# Patient Record
Sex: Female | Born: 1995 | Race: Black or African American | Hispanic: No | Marital: Single
Health system: Southern US, Community
[De-identification: ages and names within clinical notes are randomized; demographics above are authoritative.]

## PROBLEM LIST (undated history)

## (undated) DIAGNOSIS — N83209 Unspecified ovarian cyst, unspecified side: Secondary | ICD-10-CM

## (undated) DIAGNOSIS — J45909 Unspecified asthma, uncomplicated: Secondary | ICD-10-CM

## (undated) HISTORY — PX: HERNIA REPAIR: SHX51

---

## 2019-03-23 ENCOUNTER — Encounter (HOSPITAL_COMMUNITY): Payer: Self-pay | Admitting: Emergency Medicine

## 2019-03-23 ENCOUNTER — Emergency Department (HOSPITAL_COMMUNITY)
Admission: EM | Admit: 2019-03-23 | Discharge: 2019-03-23 | Disposition: A | Discharge status: Home / Self Care | Payer: 59 | Attending: Emergency Medicine | Admitting: Emergency Medicine

## 2019-03-23 ENCOUNTER — Other Ambulatory Visit: Payer: Self-pay

## 2019-03-23 ENCOUNTER — Ambulatory Visit (HOSPITAL_COMMUNITY)
Admission: EM | Admit: 2019-03-23 | Discharge: 2019-03-23 | Disposition: A | Discharge status: Other Acute Inpatient Hospital | Payer: Self-pay

## 2019-03-23 ENCOUNTER — Emergency Department (HOSPITAL_COMMUNITY): Payer: 59

## 2019-03-23 DIAGNOSIS — N83201 Unspecified ovarian cyst, right side: Secondary | ICD-10-CM

## 2019-03-23 DIAGNOSIS — N83202 Unspecified ovarian cyst, left side: Secondary | ICD-10-CM | POA: Insufficient documentation

## 2019-03-23 DIAGNOSIS — R1030 Lower abdominal pain, unspecified: Secondary | ICD-10-CM | POA: Diagnosis present

## 2019-03-23 LAB — CBC WITH DIFFERENTIAL/PLATELET
Abs Immature Granulocytes: 0.01 10*3/uL (ref 0.00–0.07)
Basophils Absolute: 0 10*3/uL (ref 0.0–0.1)
Basophils Relative: 1 %
Eosinophils Absolute: 0.3 10*3/uL (ref 0.0–0.5)
Eosinophils Relative: 5 %
HCT: 37.9 % (ref 36.0–46.0)
Hemoglobin: 12.3 g/dL (ref 12.0–15.0)
Immature Granulocytes: 0 %
Lymphocytes Relative: 36 %
Lymphs Abs: 1.8 10*3/uL (ref 0.7–4.0)
MCH: 27.4 pg (ref 26.0–34.0)
MCHC: 32.5 g/dL (ref 30.0–36.0)
MCV: 84.4 fL (ref 80.0–100.0)
Monocytes Absolute: 0.6 10*3/uL (ref 0.1–1.0)
Monocytes Relative: 12 %
Neutro Abs: 2.3 10*3/uL (ref 1.7–7.7)
Neutrophils Relative %: 46 %
Platelets: 351 10*3/uL (ref 150–400)
RBC: 4.49 MIL/uL (ref 3.87–5.11)
RDW: 15.2 % (ref 11.5–15.5)
WBC: 5 10*3/uL (ref 4.0–10.5)
nRBC: 0 % (ref 0.0–0.2)

## 2019-03-23 LAB — URINALYSIS, ROUTINE W REFLEX MICROSCOPIC
Bilirubin Urine: NEGATIVE
Glucose, UA: NEGATIVE mg/dL
Hgb urine dipstick: NEGATIVE
Ketones, ur: NEGATIVE mg/dL
Leukocytes,Ua: NEGATIVE
Nitrite: NEGATIVE
Protein, ur: NEGATIVE mg/dL
Specific Gravity, Urine: 1.032 — ABNORMAL HIGH (ref 1.005–1.030)
pH: 7 (ref 5.0–8.0)

## 2019-03-23 LAB — I-STAT BETA HCG BLOOD, ED (MC, WL, AP ONLY): I-stat hCG, quantitative: <5 m[IU]/mL (ref <5)

## 2019-03-23 LAB — COMPREHENSIVE METABOLIC PANEL
ALT: 10 U/L (ref 0–44)
AST: 18 U/L (ref 15–41)
Albumin: 3.3 g/dL — ABNORMAL LOW (ref 3.5–5.0)
Alkaline Phosphatase: 27 U/L — ABNORMAL LOW (ref 38–126)
Anion gap: 7 (ref 5–15)
BUN: 6 mg/dL (ref 6–20)
CO2: 21 mmol/L — ABNORMAL LOW (ref 22–32)
Calcium: 8.9 mg/dL (ref 8.9–10.3)
Chloride: 107 mmol/L (ref 98–111)
Creatinine, Ser: 0.81 mg/dL (ref 0.44–1.00)
GFR calc Af Amer: >60 mL/min (ref >60)
GFR calc non Af Amer: >60 mL/min (ref >60)
Glucose, Bld: 119 mg/dL — ABNORMAL HIGH (ref 70–99)
Potassium: 3.7 mmol/L (ref 3.5–5.1)
Sodium: 135 mmol/L (ref 135–145)
Total Bilirubin: 0.4 mg/dL (ref 0.3–1.2)
Total Protein: 7.4 g/dL (ref 6.5–8.1)

## 2019-03-23 LAB — LIPASE, BLOOD: Lipase: 41 U/L (ref 11–51)

## 2019-03-23 MED ORDER — NAPROXEN 375 MG PO TABS: 375.0000 mg | ORAL_TABLET | Freq: Two times a day (BID) | ORAL | 0 refills | Status: DC

## 2019-03-23 MED ORDER — SODIUM CHLORIDE 0.9 % IV BOLUS
1000.0000 mL | Freq: Once | INTRAVENOUS | Status: AC
  Administered 2019-03-23: 19:00:00 1000 mL via INTRAVENOUS

## 2019-03-23 MED ORDER — FENTANYL CITRATE (PF) 100 MCG/2ML IJ SOLN
50.0000 ug | Freq: Once | INTRAMUSCULAR | Status: AC
  Administered 2019-03-23: 50 ug via INTRAVENOUS
  Filled 2019-03-23: qty 2

## 2019-03-23 MED ORDER — ONDANSETRON HCL 4 MG/2ML IJ SOLN
4.0000 mg | Freq: Once | INTRAMUSCULAR | Status: AC
  Administered 2019-03-23: 19:00:00 4 mg via INTRAVENOUS
  Filled 2019-03-23: qty 2

## 2019-03-23 MED ORDER — IOHEXOL 300 MG/ML  SOLN
100.0000 mL | Freq: Once | INTRAMUSCULAR | Status: AC | PRN
  Administered 2019-03-23: 100 mL via INTRAVENOUS

## 2019-03-23 MED ORDER — OXYCODONE-ACETAMINOPHEN 5-325 MG PO TABS
1.0000 | ORAL_TABLET | Freq: Once | ORAL | Status: AC
  Administered 2019-03-23: 22:00:00 1 via ORAL
  Filled 2019-03-23: qty 1

## 2019-03-23 MED ORDER — ONDANSETRON HCL 4 MG PO TABS: 4.0000 mg | ORAL_TABLET | Freq: Three times a day (TID) | ORAL | 0 refills | Status: DC | PRN

## 2019-03-23 MED ORDER — HYDROCODONE-ACETAMINOPHEN 5-325 MG PO TABS: 1.0000 | ORAL_TABLET | Freq: Four times a day (QID) | ORAL | 0 refills | Status: AC | PRN

## 2019-03-23 NOTE — ED Notes (Signed)
Nurse collected labs. 

## 2019-03-23 NOTE — ED Notes (Signed)
Patient verbalizes understanding of discharge instructions. Opportunity for questioning and answers were provided. Armband removed by staff, pt discharged from ED ambulatory.   

## 2019-03-23 NOTE — ED Notes (Signed)
Asked pt for a urine sample.  She stated she could not go at this time.

## 2019-03-23 NOTE — ED Provider Notes (Signed)
[facility] EMERGENCY DEPARTMENT Provider Note   CSN: [id number] Arrival date & time: 03/23/19  1843    History   Chief Complaint No chief complaint on file.   HPI Jeanne Nelson is a 23 y.o. female who to the emergency department chief complaint of abdominal pain.  Patient states that she has been having intermittent pain in her abdomen on the right side for the past several days. Patient states that the pain is located near her bellybutton and radiates upward toward her diaphragm.  She states that the pain became severe today and is worse with any movement coughing or laughing.  It seems to radiate into her pelvis and across the pelvic region.She had one episode of vomiting today.  She had one episode of watery diarrhea and one episode of loose stools this morning.She has a history of ovarian cyst and thought that her symptoms might be related to this.  She denies vaginal symptoms, vaginal bleeding, urinary symptoms.She has no previous history of abdominal surgeries.  She denies fever or chills.She denies urinary symptoms or back pain    HPI  History reviewed. No pertinent past medical history.  There are no active problems to display for this patient.   History reviewed. No pertinent surgical history.   OB History   No obstetric history on file.      Home Medications    Prior to Admission medications   Medication Sig Start Date End Date Taking? Authorizing Provider  HYDROcodone-acetaminophen (NORCO) 5-325 MG tablet Take 1 tablet by mouth every 6 (six) hours as needed. 03/23/19   Cianni Manny, Cammy Copa, PA-C  naproxen (NAPROSYN) 375 MG tablet Take 1 tablet (375 mg total) by mouth 2 (two) times daily. 03/23/19   Arthor Captain, PA-C  ondansetron (ZOFRAN) 4 MG tablet Take 1 tablet (4 mg total) by mouth every 8 (eight) hours as needed for nausea or vomiting. 03/23/19   Arthor Captain, PA-C    Family History No family history on file.  Social History Social History    Tobacco Use  . Smoking status: Not on file  Substance Use Topics  . Alcohol use: Not on file  . Drug use: Not on file     Allergies   Patient has no allergy information on record.   Review of Systems Review of Systems Ten systems reviewed and are negative for acute change, except as noted in the HPI.    Physical Exam Updated Vital Signs BP (!) 149/104 (BP Location: Right Arm)   Pulse 81   Temp 99.6 F (37.6 C) (Oral)   Resp 16   Ht  (1.702 m)   Wt 72.6 kg   SpO2 100%   BMI 25.06 kg/m   Physical Exam Vitals signs and nursing note reviewed.  Constitutional:      General: She is not in acute distress.    Appearance: She is well-developed. She is not diaphoretic.  HENT:     Head: Normocephalic and atraumatic.  Eyes:     General: No scleral icterus.    Conjunctiva/sclera: Conjunctivae normal.  Neck:     Musculoskeletal: Normal range of motion.  Cardiovascular:     Rate and Rhythm: Normal rate and regular rhythm.     Heart sounds: Normal heart sounds. No murmur. No friction rub. No gallop.   Pulmonary:     Effort: Pulmonary effort is normal. No respiratory distress.     Breath sounds: Normal breath sounds.  Abdominal:     General: Bowel sounds  are normal. There is no distension.     Palpations: Abdomen is soft. There is no mass.     Tenderness: There is abdominal tenderness. There is no guarding.    Skin:    General: Skin is warm and dry.  Neurological:     Mental Status: She is alert and oriented to person, place, and time.  Psychiatric:        Behavior: Behavior normal.      ED Treatments / Results  Labs (all labs ordered are listed, but only abnormal results are displayed) Labs Reviewed  COMPREHENSIVE METABOLIC PANEL - Abnormal; Notable for the following components:      Result Value   CO2 21 (*)    Glucose, Bld 119 (*)    Albumin 3.3 (*)    Alkaline Phosphatase 27 (*)    All other components within normal limits  URINALYSIS, ROUTINE W  REFLEX MICROSCOPIC - Abnormal; Notable for the following components:   APPearance HAZY (*)    Specific Gravity, Urine 1.032 (*)    All other components within normal limits  CBC WITH DIFFERENTIAL/PLATELET  LIPASE, BLOOD  I-STAT BETA HCG BLOOD, ED ([facility], [facility], [facility] ONLY)    EKG None  Radiology Ct Abdomen Pelvis W Contrast  Result Date: 03/23/2019 CLINICAL DATA:  Abdominal pain EXAM: CT ABDOMEN AND PELVIS WITH CONTRAST TECHNIQUE: Multidetector CT imaging of the abdomen and pelvis was performed using the standard protocol following bolus administration of intravenous contrast. CONTRAST:  100mL OMNIPAQUE IOHEXOL 300 MG/ML  SOLN COMPARISON:  None. FINDINGS: Lower chest: Lung bases are clear. No effusions. Heart is normal size. Hepatobiliary: No focal hepatic abnormality. Gallbladder unremarkable. Pancreas: No focal abnormality or ductal dilatation. Spleen: No focal abnormality.  Normal size. Adrenals/Urinary Tract: No adrenal abnormality. No focal renal abnormality. No stones or hydronephrosis. Urinary bladder is unremarkable. Stomach/Bowel: Normal appendix. Stomach, large and small bowel grossly unremarkable. Vascular/Lymphatic: No evidence of aneurysm or adenopathy. Reproductive: Small bilateral ovarian cysts, 2.9 cm on the right and 3.2 cm on the left. Uterus unremarkable. Other: No free fluid or free air. Musculoskeletal: No acute bony abnormality. IMPRESSION: Normal appendix. No acute findings in the abdomen or pelvis. Small bilateral ovarian cysts, likely functional. Electronically Signed   By: Charlett NoseKevin  Dover M.D.   On: 03/23/2019 21:27    Procedures Procedures (including critical care time)  Medications Ordered in ED Medications  sodium chloride 0.9 % bolus 1,000 mL (0 mLs Intravenous Stopped 03/23/19 2130)  fentaNYL (SUBLIMAZE) injection 50 mcg (50 mcg Intravenous Given 03/23/19 1926)  ondansetron (ZOFRAN) injection 4 mg (4 mg Intravenous Given 03/23/19 1926)  iohexol (OMNIPAQUE) 300 MG/ML solution  100 mL (100 mLs Intravenous Contrast Given 03/23/19 2059)  oxyCODONE-acetaminophen (PERCOCET/ROXICET) 5-325 MG per tablet 1 tablet (1 tablet Oral Given 03/23/19 2226)     Initial Impression / Assessment and Plan / ED Course  I have reviewed the triage vital signs and the nursing notes.  Pertinent labs & imaging results that were available during my care of the patient were reviewed by me and considered in my medical decision making (see chart for details).  Clinical Course as of Mar 22 2330  Sat Mar 23, 2019  2034 WBC: 5.0 [AH]    Clinical Course User Index [AH] Arthor CaptainHarris, Arnulfo Batson, PA-C       Patient with Abdominal pain.  Labs shows no leukocytosis.  Urine is negative.  Negative pregnancy.  Pain is greatly improved after pain medications.  CT scan shows bilateral ovarian cyst.  She  states that the pain is similar to previous episode.  I doubt ovarian torsion.  Patient is tender in the abdominal cavity.  No flank pain.  No vaginal symptoms.  Suspect this is likely secondary to hemorrhagic cyst.  I do not feel she needs pelvic examination.  Patient appears appropriate for discharge at this time.  No evidence of appendicitis.  Discussed return precautions.  Final Clinical Impressions(s) / ED Diagnoses   Final diagnoses:  Cysts of both ovaries    ED Discharge Orders         Ordered    naproxen (NAPROSYN) 375 MG tablet  2 times daily     03/23/19 2238    HYDROcodone-acetaminophen (NORCO) 5-325 MG tablet  Every 6 hours PRN     03/23/19 2238    ondansetron (ZOFRAN) 4 MG tablet  Every 8 hours PRN     03/23/19 2238           Arthor Captain, PA-C 03/23/19 2333    Tegeler, Canary Brim, MD 03/23/19 [phone]

## 2019-03-23 NOTE — Discharge Instructions (Addendum)
Contact a health care provider if: °Your periods are late, irregular, or painful, or they stop. °You have pelvic pain that does not go away. °You have pressure on your bladder or trouble emptying your bladder completely. °You have pain during sex. °You have any of the following in your abdomen: °A feeling of fullness. °Pressure. °Discomfort. °Pain that does not go away. °Swelling. °You feel generally ill. °You become constipated. °You lose your appetite. °You develop severe acne. °You start to have more body hair and facial hair. °You are gaining weight or losing weight without changing your exercise and eating habits. °You think you may be pregnant. °Get help right away if: °You have abdominal pain that is severe or gets worse. °You cannot eat or drink without vomiting. °You suddenly develop a fever. °Your menstrual period is much heavier than usual. °

## 2019-03-23 NOTE — ED Triage Notes (Signed)
Pt here with complaints of severe abdominal pain. Pt has been nauseous all day. Pt has history of ovarian cyst.

## 2019-04-19 ENCOUNTER — Emergency Department (HOSPITAL_COMMUNITY)
Admission: EM | Admit: 2019-04-19 | Discharge: 2019-04-20 | Disposition: A | Discharge status: Home / Self Care | Payer: 59 | Attending: Emergency Medicine | Admitting: Emergency Medicine

## 2019-04-19 ENCOUNTER — Emergency Department (HOSPITAL_COMMUNITY): Payer: 59

## 2019-04-19 ENCOUNTER — Encounter (HOSPITAL_COMMUNITY): Payer: Self-pay

## 2019-04-19 ENCOUNTER — Other Ambulatory Visit: Payer: Self-pay

## 2019-04-19 DIAGNOSIS — Z79899 Other long term (current) drug therapy: Secondary | ICD-10-CM | POA: Diagnosis not present

## 2019-04-19 DIAGNOSIS — R51 Headache: Secondary | ICD-10-CM | POA: Insufficient documentation

## 2019-04-19 DIAGNOSIS — J45909 Unspecified asthma, uncomplicated: Secondary | ICD-10-CM | POA: Diagnosis not present

## 2019-04-19 DIAGNOSIS — R519 Headache, unspecified: Secondary | ICD-10-CM

## 2019-04-19 HISTORY — DX: Unspecified asthma, uncomplicated: J45.909

## 2019-04-19 LAB — CBC
HCT: 37.3 % (ref 36.0–46.0)
Hemoglobin: 12.3 g/dL (ref 12.0–15.0)
MCH: 28 pg (ref 26.0–34.0)
MCHC: 33 g/dL (ref 30.0–36.0)
MCV: 85 fL (ref 80.0–100.0)
Platelets: 328 10*3/uL (ref 150–400)
RBC: 4.39 MIL/uL (ref 3.87–5.11)
RDW: 14.1 % (ref 11.5–15.5)
WBC: 4.3 10*3/uL (ref 4.0–10.5)
nRBC: 0 % (ref 0.0–0.2)

## 2019-04-19 LAB — URINALYSIS, ROUTINE W REFLEX MICROSCOPIC
Bilirubin Urine: NEGATIVE
Glucose, UA: NEGATIVE mg/dL
Ketones, ur: NEGATIVE mg/dL
Nitrite: NEGATIVE
Protein, ur: 30 mg/dL — AB
RBC / HPF: >50 RBC/hpf — ABNORMAL HIGH (ref 0–5)
Specific Gravity, Urine: 1.027 (ref 1.005–1.030)
pH: 5 (ref 5.0–8.0)

## 2019-04-19 LAB — BASIC METABOLIC PANEL
Anion gap: 6 (ref 5–15)
BUN: 6 mg/dL (ref 6–20)
CO2: 23 mmol/L (ref 22–32)
Calcium: 9.2 mg/dL (ref 8.9–10.3)
Chloride: 103 mmol/L (ref 98–111)
Creatinine, Ser: 0.86 mg/dL (ref 0.44–1.00)
GFR calc Af Amer: >60 mL/min (ref >60)
GFR calc non Af Amer: >60 mL/min (ref >60)
Glucose, Bld: 97 mg/dL (ref 70–99)
Potassium: 3.9 mmol/L (ref 3.5–5.1)
Sodium: 132 mmol/L — ABNORMAL LOW (ref 135–145)

## 2019-04-19 LAB — I-STAT BETA HCG BLOOD, ED (MC, WL, AP ONLY): I-stat hCG, quantitative: <5 m[IU]/mL (ref <5)

## 2019-04-19 NOTE — ED Triage Notes (Signed)
Pt in POV reporting HA X5 days wth nausea, weakness, fatigue. States no hx of migraines, not relieved by OTC meds. Neuro intact.

## 2019-04-19 NOTE — ED Notes (Signed)
Pt called for CT x3 with no answer.

## 2019-04-20 ENCOUNTER — Encounter (HOSPITAL_COMMUNITY): Payer: Self-pay | Admitting: Emergency Medicine

## 2019-04-20 ENCOUNTER — Other Ambulatory Visit: Payer: Self-pay

## 2019-04-20 MED ORDER — KETOROLAC TROMETHAMINE 60 MG/2ML IM SOLN
60.0000 mg | Freq: Once | INTRAMUSCULAR | Status: AC
  Administered 2019-04-20: 60 mg via INTRAMUSCULAR
  Filled 2019-04-20: qty 2

## 2019-04-20 MED ORDER — DIVALPROEX SODIUM 250 MG PO DR TAB
500.0000 mg | DELAYED_RELEASE_TABLET | Freq: Two times a day (BID) | ORAL | Status: DC
  Administered 2019-04-20: 500 mg via ORAL
  Filled 2019-04-20: qty 2

## 2019-04-20 MED ORDER — DIPHENHYDRAMINE HCL 25 MG PO CAPS
50.0000 mg | ORAL_CAPSULE | Freq: Once | ORAL | Status: AC
  Administered 2019-04-20: 50 mg via ORAL
  Filled 2019-04-20: qty 2

## 2019-04-20 MED ORDER — METOCLOPRAMIDE HCL 10 MG PO TABS
10.0000 mg | ORAL_TABLET | Freq: Once | ORAL | Status: AC
  Administered 2019-04-20: 10 mg via ORAL
  Filled 2019-04-20: qty 1

## 2019-04-20 NOTE — ED Provider Notes (Signed)
[facility] EMERGENCY DEPARTMENT Provider Note   CSN: [id number] Arrival date & time: 04/19/19  2116    History   Chief Complaint Chief Complaint  Patient presents with  . Headache    HPI Jeanne Nelson is a 23 y.o. female.     The history is provided by the patient.  Headache  Pain location:  Frontal Quality:  Dull Radiates to:  Does not radiate Severity currently:  8/10 Severity at highest:  9/10 Onset quality:  Gradual Duration:  5 days Timing:  Constant Progression:  Unchanged Chronicity:  New Context: not activity, not caffeine and not eating   Relieved by:  Nothing Worsened by:  Nothing Ineffective treatments: vicodin and naproxen. Associated symptoms: no abdominal pain, no back pain, no blurred vision, no cough, no diarrhea, no dizziness, no eye pain, no facial pain, no fever, no neck pain, no neck stiffness, no numbness, no paresthesias, no photophobia, no sore throat, no swollen glands, no syncope, no URI, no visual change and no weakness   Risk factors: no family hx of SAH     Past Medical History:  Diagnosis Date  . Asthma     There are no active problems to display for this patient.   Past Surgical History:  Procedure Laterality Date  . HERNIA REPAIR       OB History   No obstetric history on file.      Home Medications    Prior to Admission medications   Medication Sig Start Date End Date Taking? Authorizing Provider  albuterol (VENTOLIN HFA) 108 (90 Base) MCG/ACT inhaler Inhale 1-2 puffs into the lungs every 6 (six) hours as needed for wheezing or shortness of breath.   Yes [provider]  sertraline (ZOLOFT) 50 MG tablet Take 150 mg by mouth daily. 12/24/18  Yes [provider]  HYDROcodone-acetaminophen (NORCO) 5-325 MG tablet Take 1 tablet by mouth every 6 (six) hours as needed. Patient not taking: Reported on 04/20/2019 03/23/19   Arthor Captain, PA-C  naproxen (NAPROSYN) 375 MG tablet Take 1 tablet  (375 mg total) by mouth 2 (two) times daily. Patient not taking: Reported on 04/20/2019 03/23/19   Arthor Captain, PA-C  ondansetron (ZOFRAN) 4 MG tablet Take 1 tablet (4 mg total) by mouth every 8 (eight) hours as needed for nausea or vomiting. Patient not taking: Reported on 04/20/2019 03/23/19   Arthor Captain, PA-C    Family History No family history on file.  Social History Social History   Tobacco Use  . Smoking status: Never Smoker  . Smokeless tobacco: Never Used  Substance Use Topics  . Alcohol use: Yes    Comment: Socially   . Drug use: Not Currently     Allergies   Patient has no known allergies.   Review of Systems Review of Systems  Constitutional: Negative for fever.  HENT: Negative for sore throat and voice change.   Eyes: Negative for blurred vision, photophobia and pain.  Respiratory: Negative for cough and shortness of breath.   Cardiovascular: Negative for syncope.  Gastrointestinal: Negative for abdominal pain and diarrhea.  Musculoskeletal: Negative for back pain, neck pain and neck stiffness.  Skin: Negative for rash.  Neurological: Positive for headaches. Negative for dizziness, weakness, numbness and paresthesias.  All other systems reviewed and are negative.    Physical Exam Updated Vital Signs BP 114/70   Pulse 89   Temp 98.6 F (37 C) (Oral)   Resp (!) 21   Ht 5\' 7"  (1.702  m)   Wt 74.8 kg   SpO2 100%   BMI 25.84 kg/m   Physical Exam Vitals signs and nursing note reviewed.  Constitutional:      General: She is not in acute distress.    Appearance: She is normal weight.  HENT:     Head: Normocephalic and atraumatic.     Nose: Nose normal.     Mouth/Throat:     Mouth: Mucous membranes are moist.  Eyes:     Extraocular Movements: Extraocular movements intact.     Conjunctiva/sclera: Conjunctivae normal.     Pupils: Pupils are equal, round, and reactive to light.     Comments: No proptosis, intact cognition  Neck:      Musculoskeletal: Normal range of motion and neck supple.  Cardiovascular:     Rate and Rhythm: Normal rate and regular rhythm.     Pulses: Normal pulses.     Heart sounds: Normal heart sounds.  Pulmonary:     Effort: Pulmonary effort is normal.     Breath sounds: Normal breath sounds.  Abdominal:     General: Abdomen is flat.  Musculoskeletal: Normal range of motion.  Skin:    General: Skin is warm and dry.     Capillary Refill: Capillary refill takes less than 2 seconds.  Neurological:     General: No focal deficit present.     Mental Status: She is alert and oriented to person, place, and time.     Cranial Nerves: No cranial nerve deficit.     Deep Tendon Reflexes: Reflexes normal.  Psychiatric:        Mood and Affect: Mood normal.        Behavior: Behavior normal.      ED Treatments / Results  Labs (all labs ordered are listed, but only abnormal results are displayed) Results for orders placed or performed during the hospital encounter of 04/19/19  Basic metabolic panel  Result Value Ref Range   Sodium 132 (L) 135 - 145 mmol/L   Potassium 3.9 3.5 - 5.1 mmol/L   Chloride 103 98 - 111 mmol/L   CO2 23 22 - 32 mmol/L   Glucose, Bld 97 70 - 99 mg/dL   BUN 6 6 - 20 mg/dL   Creatinine, Ser 1.61 0.44 - 1.00 mg/dL   Calcium 9.2 8.9 - 09.6 mg/dL   GFR calc non Af Amer >60 >60 mL/min   GFR calc Af Amer >60 >60 mL/min   Anion gap 6 5 - 15  CBC  Result Value Ref Range   WBC 4.3 4.0 - 10.5 K/uL   RBC 4.39 3.87 - 5.11 MIL/uL   Hemoglobin 12.3 12.0 - 15.0 g/dL   HCT 04.5 40.9 - 81.1 %   MCV 85.0 80.0 - 100.0 fL   MCH 28.0 26.0 - 34.0 pg   MCHC 33.0 30.0 - 36.0 g/dL   RDW 91.4 78.2 - 95.6 %   Platelets 328 150 - 400 K/uL   nRBC 0.0 0.0 - 0.2 %  Urinalysis, Routine w reflex microscopic  Result Value Ref Range   Color, Urine YELLOW YELLOW   APPearance CLOUDY (A) CLEAR   Specific Gravity, Urine 1.027 1.005 - 1.030   pH 5.0 5.0 - 8.0   Glucose, UA NEGATIVE NEGATIVE mg/dL    Hgb urine dipstick LARGE (A) NEGATIVE   Bilirubin Urine NEGATIVE NEGATIVE   Ketones, ur NEGATIVE NEGATIVE mg/dL   Protein, ur 30 (A) NEGATIVE mg/dL   Nitrite NEGATIVE NEGATIVE  Leukocytes,Ua SMALL (A) NEGATIVE   RBC / HPF >50 (H) 0 - 5 RBC/hpf   WBC, UA 11-20 0 - 5 WBC/hpf   Bacteria, UA FEW (A) NONE SEEN   Squamous Epithelial / LPF 0-5 0 - 5   Mucus PRESENT   I-Stat Beta hCG blood, ED ([facility], [facility], [facility] only)  Result Value Ref Range   I-stat hCG, quantitative <5.0 <5 mIU/mL   Comment 3           Ct Head Wo Contrast  Result Date: 04/19/2019 CLINICAL DATA:  Headache EXAM: CT HEAD WITHOUT CONTRAST TECHNIQUE: Contiguous axial images were obtained from the base of the skull through the vertex without intravenous contrast. COMPARISON:  None. FINDINGS: Brain: No evidence of acute infarction, hemorrhage, hydrocephalus, extra-axial collection or mass lesion/mass effect. Vascular: No hyperdense vessel or unexpected calcification. Skull: Normal. Negative for fracture or focal lesion. Sinuses/Orbits: No acute finding. Other: None. IMPRESSION: Normal study. Electronically Signed   By: Katherine Mantlehristopher  Green M.D.   On: 04/19/2019 22:29   Ct Abdomen Pelvis W Contrast  Result Date: 03/23/2019 CLINICAL DATA:  Abdominal pain EXAM: CT ABDOMEN AND PELVIS WITH CONTRAST TECHNIQUE: Multidetector CT imaging of the abdomen and pelvis was performed using the standard protocol following bolus administration of intravenous contrast. CONTRAST:  100mL OMNIPAQUE IOHEXOL 300 MG/ML  SOLN COMPARISON:  None. FINDINGS: Lower chest: Lung bases are clear. No effusions. Heart is normal size. Hepatobiliary: No focal hepatic abnormality. Gallbladder unremarkable. Pancreas: No focal abnormality or ductal dilatation. Spleen: No focal abnormality.  Normal size. Adrenals/Urinary Tract: No adrenal abnormality. No focal renal abnormality. No stones or hydronephrosis. Urinary bladder is unremarkable. Stomach/Bowel: Normal appendix. Stomach, large and  small bowel grossly unremarkable. Vascular/Lymphatic: No evidence of aneurysm or adenopathy. Reproductive: Small bilateral ovarian cysts, 2.9 cm on the right and 3.2 cm on the left. Uterus unremarkable. Other: No free fluid or free air. Musculoskeletal: No acute bony abnormality. IMPRESSION: Normal appendix. No acute findings in the abdomen or pelvis. Small bilateral ovarian cysts, likely functional. Electronically Signed   By: Charlett NoseKevin  Dover M.D.   On: 03/23/2019 21:27    EKG None  Radiology Ct Head Wo Contrast  Result Date: 04/19/2019 CLINICAL DATA:  Headache EXAM: CT HEAD WITHOUT CONTRAST TECHNIQUE: Contiguous axial images were obtained from the base of the skull through the vertex without intravenous contrast. COMPARISON:  None. FINDINGS: Brain: No evidence of acute infarction, hemorrhage, hydrocephalus, extra-axial collection or mass lesion/mass effect. Vascular: No hyperdense vessel or unexpected calcification. Skull: Normal. Negative for fracture or focal lesion. Sinuses/Orbits: No acute finding. Other: None. IMPRESSION: Normal study. Electronically Signed   By: Katherine Mantlehristopher  Green M.D.   On: 04/19/2019 22:29    Procedures Procedures (including critical care time)  Medications Ordered in ED Medications  divalproex (DEPAKOTE) DR tablet 500 mg (500 mg Oral Given 04/20/19 0518)  ketorolac (TORADOL) injection 60 mg (60 mg Intramuscular Given 04/20/19 0518)  metoCLOPramide (REGLAN) tablet 10 mg (10 mg Oral Given 04/20/19 0518)  diphenhydrAMINE (BENADRYL) capsule 50 mg (50 mg Oral Given 04/20/19 0518)     Suspect analgesic rebound HA.  Negative labs and imaging,  No signs of infection, ICH or cavernous sinus thrombosis.    Final Clinical Impressions(s) / ED Diagnoses      Return for intractable cough, coughing up blood,fevers >100.4 unrelieved by medication, shortness of breath, intractable vomiting, chest pain, shortness of breath, weakness,numbness, changes in speech, facial  asymmetry,abdominal pain, passing out,Inability to tolerate liquids or food, cough, altered mental status  or any concerns. No signs of systemic illness or infection. The patient is nontoxic-appearing on exam and vital signs are within normal limits.   I have reviewed the triage vital signs and the nursing notes. Pertinent labs &imaging results that were available during my care of the patient were reviewed by me and considered in my medical decision making (see chart for details).  After history, exam, and medical workup I feel the patient has been appropriately medically screened and is safe for discharge home. Pertinent diagnoses were discussed with the patient. Patient was given return precautions    ED Discharge Orders       Kimya Mccahill, MD 04/20/19 [phone]

## 2019-04-20 NOTE — ED Notes (Signed)
Pt understanding of discharge instructions  

## 2019-06-30 ENCOUNTER — Other Ambulatory Visit: Payer: Self-pay

## 2019-06-30 ENCOUNTER — Encounter (HOSPITAL_COMMUNITY): Payer: Self-pay | Admitting: *Deleted

## 2019-06-30 ENCOUNTER — Ambulatory Visit (HOSPITAL_COMMUNITY)
Admission: EM | Admit: 2019-06-30 | Discharge: 2019-06-30 | Disposition: A | Discharge status: Home / Self Care | Payer: 59 | Attending: Emergency Medicine | Admitting: Emergency Medicine

## 2019-06-30 DIAGNOSIS — J45909 Unspecified asthma, uncomplicated: Secondary | ICD-10-CM | POA: Diagnosis not present

## 2019-06-30 DIAGNOSIS — Z20822 Contact with and (suspected) exposure to covid-19: Secondary | ICD-10-CM

## 2019-06-30 DIAGNOSIS — R110 Nausea: Secondary | ICD-10-CM

## 2019-06-30 DIAGNOSIS — R0789 Other chest pain: Secondary | ICD-10-CM | POA: Diagnosis not present

## 2019-06-30 DIAGNOSIS — R059 Cough, unspecified: Secondary | ICD-10-CM

## 2019-06-30 DIAGNOSIS — Z79899 Other long term (current) drug therapy: Secondary | ICD-10-CM | POA: Insufficient documentation

## 2019-06-30 DIAGNOSIS — R05 Cough: Secondary | ICD-10-CM

## 2019-06-30 DIAGNOSIS — R079 Chest pain, unspecified: Secondary | ICD-10-CM | POA: Diagnosis not present

## 2019-06-30 DIAGNOSIS — Z20828 Contact with and (suspected) exposure to other viral communicable diseases: Secondary | ICD-10-CM | POA: Diagnosis not present

## 2019-06-30 HISTORY — DX: Unspecified ovarian cyst, unspecified side: N83.209

## 2019-06-30 MED ORDER — DEXAMETHASONE 10 MG/ML FOR PEDIATRIC ORAL USE
INTRAMUSCULAR | Status: AC
  Filled 2019-06-30: qty 1

## 2019-06-30 NOTE — ED Triage Notes (Addendum)
C/O starting with cough few weeks ago - mostly at night.  Cough has progressively gotten worse.  Started with n/v 2 days ago.  States able to keep down PO fluids.  C/O chest discomfort with productive cough now.  Denies fevers. Started with HA since arrival at [facility].

## 2019-06-30 NOTE — ED Provider Notes (Signed)
[place]    CSN: [id number] Arrival date & time: 06/30/19  1531     History   Chief Complaint Chief Complaint  Patient presents with  . Cough  . Emesis    HPI Jeanne Nelson is a 23 y.o. female.   Patient presents with nonproductive cough x 2 weeks.  She states she now has pain in her chest when she coughs. She is having nausea for 2 days; vomited once 2 days ago but none since.   She denies fever, chills, shortness of breath, diarrhea or other symptoms.  She has asthma; denies cardiac history.  LMP: 2 weeks.  The history is provided by the patient.    Past Medical History:  Diagnosis Date  . Asthma   . Ovarian cyst     There are no active problems to display for this patient.   Past Surgical History:  Procedure Laterality Date  . HERNIA REPAIR      OB History   No obstetric history on file.      Home Medications    Prior to Admission medications   Medication Sig Start Date End Date Taking? Authorizing Provider  ondansetron (ZOFRAN) 4 MG tablet Take 1 tablet (4 mg total) by mouth every 8 (eight) hours as needed for nausea or vomiting. 03/23/19  Yes Harris, Abigail, PA-C  OXYCODONE HCL PO Take by mouth.   Yes [provider]  sertraline (ZOLOFT) 50 MG tablet Take 150 mg by mouth daily. 12/24/18  Yes [provider]  albuterol (VENTOLIN HFA) 108 (90 Base) MCG/ACT inhaler Inhale 1-2 puffs into the lungs every 6 (six) hours as needed for wheezing or shortness of breath.    [provider]  HYDROcodone-acetaminophen (NORCO) 5-325 MG tablet Take 1 tablet by mouth every 6 (six) hours as needed. Patient not taking: Reported on 04/20/2019 03/23/19   Margarita Mail, PA-C  naproxen (NAPROSYN) 375 MG tablet Take 1 tablet (375 mg total) by mouth 2 (two) times daily. Patient not taking: Reported on 04/20/2019 03/23/19   Margarita Mail, PA-C    Family History Family History  Problem Relation Age of Onset  . Healthy Mother   . Healthy  Father     Social History Social History   Tobacco Use  . Smoking status: Passive Smoke Exposure - Never Smoker  . Smokeless tobacco: Never Used  Substance Use Topics  . Alcohol use: Yes    Comment: Socially   . Drug use: Never     Allergies   Patient has no known allergies.   Review of Systems Review of Systems  Constitutional: Negative for chills and fever.  HENT: Negative for ear pain and sore throat.   Eyes: Negative for pain and visual disturbance.  Respiratory: Positive for cough. Negative for shortness of breath.   Cardiovascular: Positive for chest pain. Negative for palpitations.  Gastrointestinal: Positive for nausea and vomiting. Negative for abdominal pain and diarrhea.  Genitourinary: Negative for dysuria and hematuria.  Musculoskeletal: Negative for arthralgias and back pain.  Skin: Negative for color change and rash.  Neurological: Negative for seizures and syncope.  All other systems reviewed and are negative.    Physical Exam Triage Vital Signs ED Triage Vitals  Enc Vitals Group     BP 06/30/19 1632 137/79     Pulse Rate 06/30/19 1632 68     Resp --      Temp 06/30/19 1632 98.2 F (36.8 C)     Temp Source 06/30/19 1632 Temporal  SpO2 06/30/19 1632 100 %     Weight --      Height --      Head Circumference --      Peak Flow --      Pain Score 06/30/19 1635 4     Pain Loc --      Pain Edu? --      Excl. in GC? --    No data found.  Updated Vital Signs BP 137/79   Pulse 68   Temp 98.2 F (36.8 C) (Temporal)   LMP 06/18/2019 (Exact Date)   SpO2 100%   Visual Acuity Right Eye Distance:   Left Eye Distance:   Bilateral Distance:    Right Eye Near:   Left Eye Near:    Bilateral Near:     Physical Exam Vitals signs and nursing note reviewed.  Constitutional:      General: She is not in acute distress.    Appearance: She is well-developed.  HENT:     Head: Normocephalic and atraumatic.     Right Ear: Tympanic membrane  normal.     Left Ear: Tympanic membrane normal.     Nose: Nose normal.     Mouth/Throat:     Mouth: Mucous membranes are moist.     Pharynx: Oropharynx is clear. No oropharyngeal exudate or posterior oropharyngeal erythema.  Eyes:     Conjunctiva/sclera: Conjunctivae normal.  Neck:     Musculoskeletal: Neck supple.  Cardiovascular:     Rate and Rhythm: Normal rate and regular rhythm.     Heart sounds: No murmur.  Pulmonary:     Effort: Pulmonary effort is normal. No respiratory distress.     Breath sounds: Normal breath sounds.  Abdominal:     General: Bowel sounds are normal.     Palpations: Abdomen is soft.     Tenderness: There is no abdominal tenderness. There is no guarding or rebound.  Skin:    General: Skin is warm and dry.     Findings: No rash.  Neurological:     Mental Status: She is alert.      UC Treatments / Results  Labs (all labs ordered are listed, but only abnormal results are displayed) Labs Reviewed  NOVEL CORONAVIRUS, NAA (HOSPITAL ORDER, SEND-OUT TO REF LAB)    EKG   Radiology No results found.  Procedures Procedures (including critical care time)  Medications Ordered in UC Medications  dexamethasone (DECADRON) 10 MG/ML injection for Pediatric ORAL use (has no administration in time range)    Initial Impression / Assessment and Plan / UC Course  I have reviewed the triage vital signs and the nursing notes.  Pertinent labs & imaging results that were available during my care of the patient were reviewed by me and considered in my medical decision making (see chart for details).   Cough, Suspect COVID.  EKG shows NSR with rate of 63.  COVID test performed here and pending.  Instructed patient to self quarantine until the test results back.  Instructed patient to go to the emergency department if she develops shortness of breath, high fever, severe diarrhea, worsening chest pain, or other symptoms.  Discussed with patient that she can take  Robitussin or Mucinex as needed.    Final Clinical Impressions(s) / UC Diagnoses   Final diagnoses:  Cough  Suspected Covid-19 Virus Infection     Discharge Instructions     Your EKG did not show any abnormality today.    Your COVID test  is pending.  You should self quarantine until your test result is back and is negative.    Go to the emergency department if you develop shortness of breath, high fever, severe diarrhea, or other concerning symptoms.         ED Prescriptions    None     Controlled Substance Prescriptions [place] Controlled Substance Registry consulted? Not Applicable   Mickie Bailate, Hansini Clodfelter H, NP 06/30/19 1725

## 2019-06-30 NOTE — Discharge Instructions (Addendum)
Your EKG did not show any abnormality today.    Your COVID test is pending.  You should self quarantine until your test result is back and is negative.    Go to the emergency department if you develop shortness of breath, high fever, severe diarrhea, or other concerning symptoms.

## 2019-07-01 LAB — NOVEL CORONAVIRUS, NAA (HOSP ORDER, SEND-OUT TO REF LAB; TAT 18-24 HRS): SARS-CoV-2, NAA: NOT DETECTED

## 2019-07-03 ENCOUNTER — Encounter (HOSPITAL_COMMUNITY): Payer: Self-pay

## 2020-01-24 ENCOUNTER — Encounter (HOSPITAL_COMMUNITY): Payer: Self-pay | Admitting: Emergency Medicine

## 2020-01-24 ENCOUNTER — Other Ambulatory Visit: Payer: Self-pay

## 2020-01-24 ENCOUNTER — Emergency Department (HOSPITAL_COMMUNITY)
Admission: EM | Admit: 2020-01-24 | Discharge: 2020-01-24 | Disposition: A | Discharge status: Home / Self Care | Payer: BC Managed Care – PPO | Attending: Emergency Medicine | Admitting: Emergency Medicine

## 2020-01-24 ENCOUNTER — Emergency Department (HOSPITAL_COMMUNITY): Payer: BC Managed Care – PPO

## 2020-01-24 DIAGNOSIS — Z7722 Contact with and (suspected) exposure to environmental tobacco smoke (acute) (chronic): Secondary | ICD-10-CM | POA: Insufficient documentation

## 2020-01-24 DIAGNOSIS — R071 Chest pain on breathing: Secondary | ICD-10-CM | POA: Diagnosis not present

## 2020-01-24 DIAGNOSIS — R110 Nausea: Secondary | ICD-10-CM | POA: Diagnosis not present

## 2020-01-24 DIAGNOSIS — M542 Cervicalgia: Secondary | ICD-10-CM | POA: Insufficient documentation

## 2020-01-24 DIAGNOSIS — J45909 Unspecified asthma, uncomplicated: Secondary | ICD-10-CM | POA: Diagnosis not present

## 2020-01-24 DIAGNOSIS — Z79899 Other long term (current) drug therapy: Secondary | ICD-10-CM | POA: Diagnosis not present

## 2020-01-24 DIAGNOSIS — R0789 Other chest pain: Secondary | ICD-10-CM | POA: Diagnosis not present

## 2020-01-24 LAB — I-STAT BETA HCG BLOOD, ED (MC, WL, AP ONLY): I-stat hCG, quantitative: <5 m[IU]/mL (ref <5)

## 2020-01-24 LAB — BASIC METABOLIC PANEL
Anion gap: 10 (ref 5–15)
BUN: 5 mg/dL — ABNORMAL LOW (ref 6–20)
CO2: 22 mmol/L (ref 22–32)
Calcium: 9.2 mg/dL (ref 8.9–10.3)
Chloride: 101 mmol/L (ref 98–111)
Creatinine, Ser: 0.9 mg/dL (ref 0.44–1.00)
GFR calc Af Amer: >60 mL/min (ref >60)
GFR calc non Af Amer: >60 mL/min (ref >60)
Glucose, Bld: 101 mg/dL — ABNORMAL HIGH (ref 70–99)
Potassium: 3.7 mmol/L (ref 3.5–5.1)
Sodium: 133 mmol/L — ABNORMAL LOW (ref 135–145)

## 2020-01-24 LAB — CBC
HCT: 36.5 % (ref 36.0–46.0)
Hemoglobin: 11.5 g/dL — ABNORMAL LOW (ref 12.0–15.0)
MCH: 25.4 pg — ABNORMAL LOW (ref 26.0–34.0)
MCHC: 31.5 g/dL (ref 30.0–36.0)
MCV: 80.6 fL (ref 80.0–100.0)
Platelets: 322 10*3/uL (ref 150–400)
RBC: 4.53 MIL/uL (ref 3.87–5.11)
RDW: 15.2 % (ref 11.5–15.5)
WBC: 5.3 10*3/uL (ref 4.0–10.5)
nRBC: 0 % (ref 0.0–0.2)

## 2020-01-24 LAB — PROTIME-INR
INR: 1 (ref 0.8–1.2)
Prothrombin Time: 13.2 seconds (ref 11.4–15.2)

## 2020-01-24 LAB — TROPONIN I (HIGH SENSITIVITY): Troponin I (High Sensitivity): 3 ng/L (ref <18)

## 2020-01-24 MED ORDER — NAPROXEN 500 MG PO TABS: 500.0000 mg | ORAL_TABLET | Freq: Two times a day (BID) | ORAL | 0 refills | Status: AC

## 2020-01-24 MED ORDER — SODIUM CHLORIDE 0.9% FLUSH: 3.0000 mL | Freq: Once | INTRAVENOUS | Status: DC

## 2020-01-24 MED ORDER — KETOROLAC TROMETHAMINE 15 MG/ML IJ SOLN
15.0000 mg | Freq: Once | INTRAMUSCULAR | Status: AC
  Administered 2020-01-24: 23:00:00 15 mg via INTRAMUSCULAR
  Filled 2020-01-24: qty 1

## 2020-01-24 NOTE — ED Triage Notes (Addendum)
Patient reports intermittent central chest pain with mild SOB worse with exertion / pain increases with deep inspiration onset today , denies cough or fever .

## 2020-01-24 NOTE — ED Provider Notes (Signed)
[facility] EMERGENCY DEPARTMENT Provider Note   CSN: [id number] Arrival date & time: 01/24/20  2057     History Chief Complaint  Patient presents with  . Chest Pain    Jeanne Nelson is a 24 y.o. female.  Patient is a 24 year old female past medical history of exercise-induced asthma and ovarian cyst presenting to the emergency department for left-sided chest pain over the last couple of days.  Patient reports that this is increased with movement of her neck as well as increase in deep breaths.  Reports that she also has pain in her neck and shoulder.  She does state that she has had increased anxiety as well.  She has not tried anything for relief.  She denies any shortness of breath, fever, chills, URI symptoms.  Reports intermittent nausea.  Reports 15 pound weight loss in the last 1 to 2 months.        Past Medical History:  Diagnosis Date  . Asthma   . Ovarian cyst     There are no problems to display for this patient.   Past Surgical History:  Procedure Laterality Date  . HERNIA REPAIR       OB History   No obstetric history on file.     Family History  Problem Relation Age of Onset  . Healthy Mother   . Healthy Father     Social History   Tobacco Use  . Smoking status: Passive Smoke Exposure - Never Smoker  . Smokeless tobacco: Never Used  Substance Use Topics  . Alcohol use: Yes    Comment: Socially   . Drug use: Never    Home Medications Prior to Admission medications   Medication Sig Start Date End Date Taking? Authorizing Provider  albuterol (VENTOLIN HFA) 108 (90 Base) MCG/ACT inhaler Inhale 1-2 puffs into the lungs every 6 (six) hours as needed for wheezing or shortness of breath.   Yes [provider]  cloNIDine (CATAPRES) 0.1 MG tablet Take 0.1 mg by mouth as needed (blood pressure).  01/15/20  Yes [provider]  omeprazole (PRILOSEC) 40 MG capsule Take 40 mg by mouth daily. 01/13/20  Yes [provider]  Oxcarbazepine (TRILEPTAL) 300 MG tablet Take 300 mg by mouth daily.   Yes [provider]  sertraline (ZOLOFT) 50 MG tablet Take 25 mg by mouth daily.  12/24/18  Yes [provider]  HYDROcodone-acetaminophen (NORCO) 5-325 MG tablet Take 1 tablet by mouth every 6 (six) hours as needed. Patient not taking: Reported on 04/20/2019 03/23/19   Arthor Captain, PA-C  naproxen (NAPROSYN) 500 MG tablet Take 1 tablet (500 mg total) by mouth 2 (two) times daily. 01/24/20   Ronnie Doss A, PA-C  ondansetron (ZOFRAN) 4 MG tablet Take 1 tablet (4 mg total) by mouth every 8 (eight) hours as needed for nausea or vomiting. Patient not taking: Reported on 01/24/2020 03/23/19   Arthor Captain, PA-C    Allergies    Lactose intolerance (gi)  Review of Systems   Review of Systems  Constitutional: Positive for fatigue. Negative for appetite change, chills and fever.  HENT: Negative for congestion, rhinorrhea and sore throat.   Eyes: Negative for visual disturbance.  Respiratory: Negative for cough and shortness of breath.   Cardiovascular: Positive for chest pain. Negative for palpitations and leg swelling.  Gastrointestinal: Positive for nausea. Negative for abdominal pain and vomiting.  Genitourinary: Negative for dysuria.  Musculoskeletal: Negative for arthralgias and back pain.  Skin: Negative for  rash and wound.  Neurological: Negative for dizziness and headaches.  Psychiatric/Behavioral: Positive for sleep disturbance. The patient is nervous/anxious.   All other systems reviewed and are negative.   Physical Exam Updated Vital Signs BP 138/86 (BP Location: Right Arm)   Pulse 75   Temp 99.6 F (37.6 C) (Oral)   Resp 18   Ht 5\' 7"  (1.702 m)   Wt 80 kg   LMP 12/31/2019   SpO2 100%   BMI 27.62 kg/m   Physical Exam Vitals and nursing note reviewed.  Constitutional:      General: She is not in acute distress.    Appearance: Normal appearance. She is  well-developed. She is not ill-appearing, toxic-appearing or diaphoretic.  HENT:     Head: Normocephalic.  Eyes:     Conjunctiva/sclera: Conjunctivae normal.     Pupils: Pupils are equal, round, and reactive to light.  Cardiovascular:     Rate and Rhythm: Normal rate and regular rhythm.     Heart sounds: Normal heart sounds.  Pulmonary:     Effort: Pulmonary effort is normal.     Breath sounds: Normal breath sounds.  Chest:    Skin:    General: Skin is warm and dry.     Capillary Refill: Capillary refill takes less than 2 seconds.  Neurological:     General: No focal deficit present.     Mental Status: She is alert.     Cranial Nerves: No cranial nerve deficit.  Psychiatric:        Mood and Affect: Mood normal.     ED Results / Procedures / Treatments   Labs (all labs ordered are listed, but only abnormal results are displayed) Labs Reviewed  BASIC METABOLIC PANEL - Abnormal; Notable for the following components:      Result Value   Sodium 133 (*)    Glucose, Bld 101 (*)    BUN 5 (*)    All other components within normal limits  CBC - Abnormal; Notable for the following components:   Hemoglobin 11.5 (*)    MCH 25.4 (*)    All other components within normal limits  PROTIME-INR  I-STAT BETA HCG BLOOD, ED ([facility], [facility], [facility] ONLY)  TROPONIN I (HIGH SENSITIVITY)  TROPONIN I (HIGH SENSITIVITY)    EKG EKG Interpretation  Date/Time:  Friday January 24 2020 21:02:20 EST Ventricular Rate:  78 PR Interval:  138 QRS Duration: 80 QT Interval:  326 QTC Calculation: 371 R Axis:   21 Text Interpretation: Normal sinus rhythm with sinus arrhythmia Normal ECG When compared to prior, no significant changes seen. No STEMI Confirmed by [id number] (Theda Belfast) on 01/24/2020 11:04:43 PM   Radiology DG Chest 2 View  Result Date: 01/24/2020 CLINICAL DATA:  Chest pain EXAM: CHEST - 2 VIEW COMPARISON:  None. FINDINGS: The heart size and mediastinal contours are within normal limits. Both  lungs are clear. The visualized skeletal structures are unremarkable. IMPRESSION: No active cardiopulmonary disease. Electronically Signed   By: 01/26/2020 M.D.   On: 01/24/2020 21:50    Procedures Procedures (including critical care time)  Medications Ordered in ED Medications  sodium chloride flush (NS) 0.9 % injection 3 mL (has no administration in time range)  ketorolac (TORADOL) 15 MG/ML injection 15 mg (has no administration in time range)    ED Course  I have reviewed the triage vital signs and the nursing notes.  Pertinent labs & imaging results that were available during my care of the patient were  reviewed by me and considered in my medical decision making (see chart for details).  Clinical Course as of Jan 23 2315  Fri Jan 23, 4770  5815 24 year old otherwise healthy female presenting to the emergency department for chest pain over the last couple of days.  Her chest pain is easily reproduced with she has slight tenderness to palpation over the left chest.  She admits to anxiety as well.  I feel strongly given her work-up of normal EKG, normal chest x-ray, troponin of 3 and otherwise unremarkable lab work that this may be just chest wall pain.  Patient's initial pulse was 102.  Since then her pulse has been in the 70s and regular.  I discussed with the patient our decision-making tools for pulmonary embolism. Discussed with her the option of drawing a Ddimer but also reassured her that she is otherwise PERC negative and pulse has been normal since after her first set of vitals. We mutually agreed that ddimer was not necessary at this time but she was welcomed to return to the ER at anytime if she had any new or worsening symptoms,    [KM]    Clinical Course User Index [KM] Kristine Royal   MDM Rules/Calculators/A&P                      Based on review of vitals, medical screening exam, lab work and/or imaging, there does not appear to be an acute, emergent etiology for  the patient's symptoms. Counseled pt on good return precautions and encouraged both PCP and ED follow-up as needed.  Prior to discharge, I also discussed incidental imaging findings with patient in detail and advised appropriate, recommended follow-up in detail.  Clinical Impression: 1. Chest wall pain     Disposition: Discharge  Prior to providing a prescription for a controlled substance, I independently reviewed the patient's recent prescription history on the [place]. The patient had no recent or regular prescriptions and was deemed appropriate for a brief, less than 3 day prescription of narcotic for acute analgesia.  This note was prepared with assistance of Systems analyst. Occasional wrong-word or sound-a-like substitutions may have occurred due to the inherent limitations of voice recognition software.  Final Clinical Impression(s) / ED Diagnoses Final diagnoses:  Chest wall pain    Rx / DC Orders ED Discharge Orders         Ordered    naproxen (NAPROSYN) 500 MG tablet  2 times daily     01/24/20 2315           Kristine Royal 01/24/20 2317    Tegeler, Gwenyth Allegra, MD 01/25/20 0025

## 2020-01-24 NOTE — Discharge Instructions (Signed)
You are seen today for chest pain.  Your work-up was very reassuring including normal cardiac enzymes, normal EKG and normal chest x-ray and other normal labs.  Initially, your heart rate was just very slightly above normal at 102 bpm.  This otherwise normalized in the remaining vital signs including your respiratory rate, oxygen levels and blood pressure remained normal.  We are reassured that your pain in your chest is not coming from an acute, emergency such as a heart attack.  We think that the pain in your chest is coming from wall muscle pain.  This is treated with rest and anti-inflammatory medications which I have sent to the pharmacy.  Please do not hesitate to return to the emergency department if you have any new or worsening symptoms.  Otherwise please follow-up with your primary care doctor.

## 2020-10-24 IMAGING — DX DG CHEST 2V
2 series · 2 of 2 positions shown · non-contrast
Comparison: None.

CLINICAL DATA: Chest pain

EXAM:
CHEST - 2 VIEW

[w chest pa]
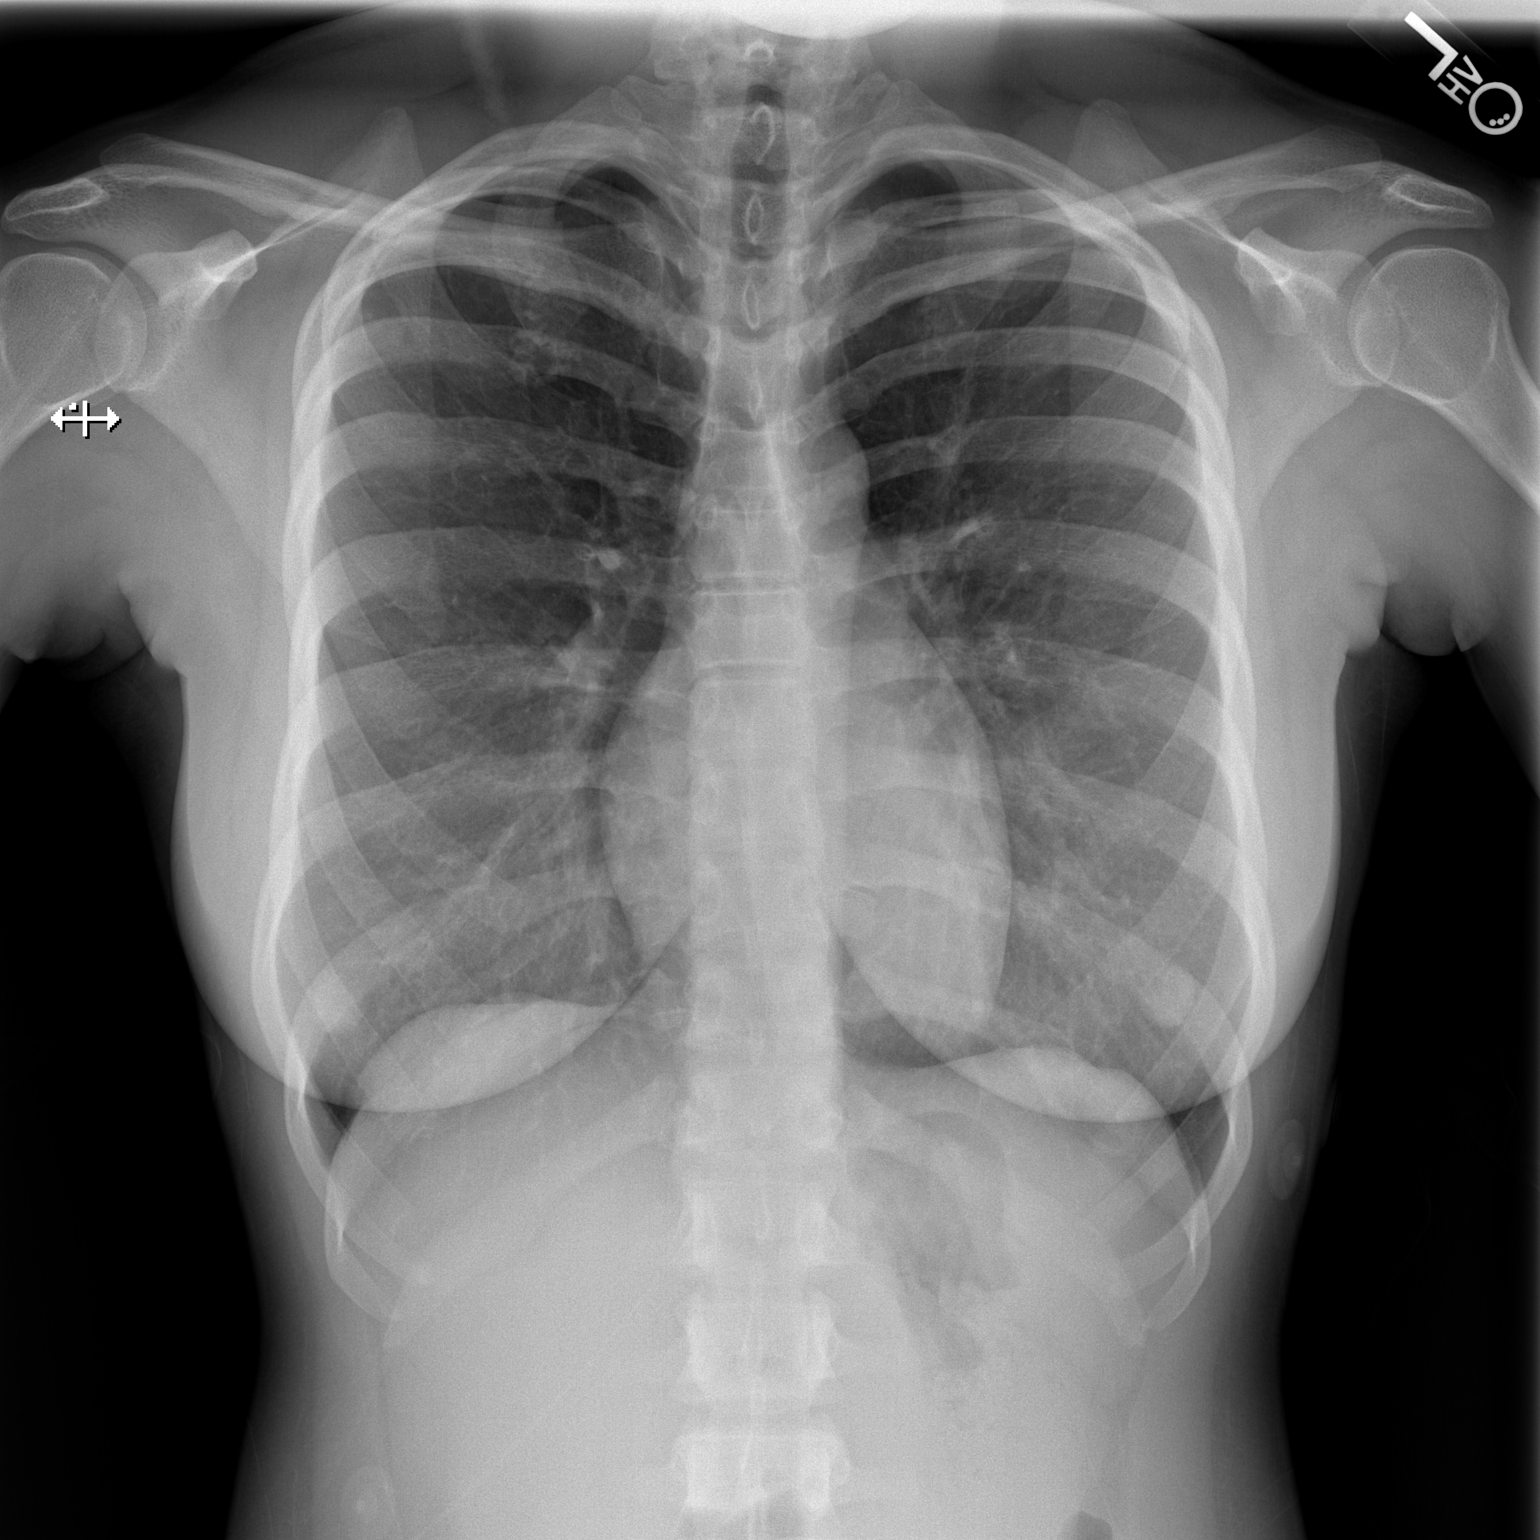

[w chest lat]
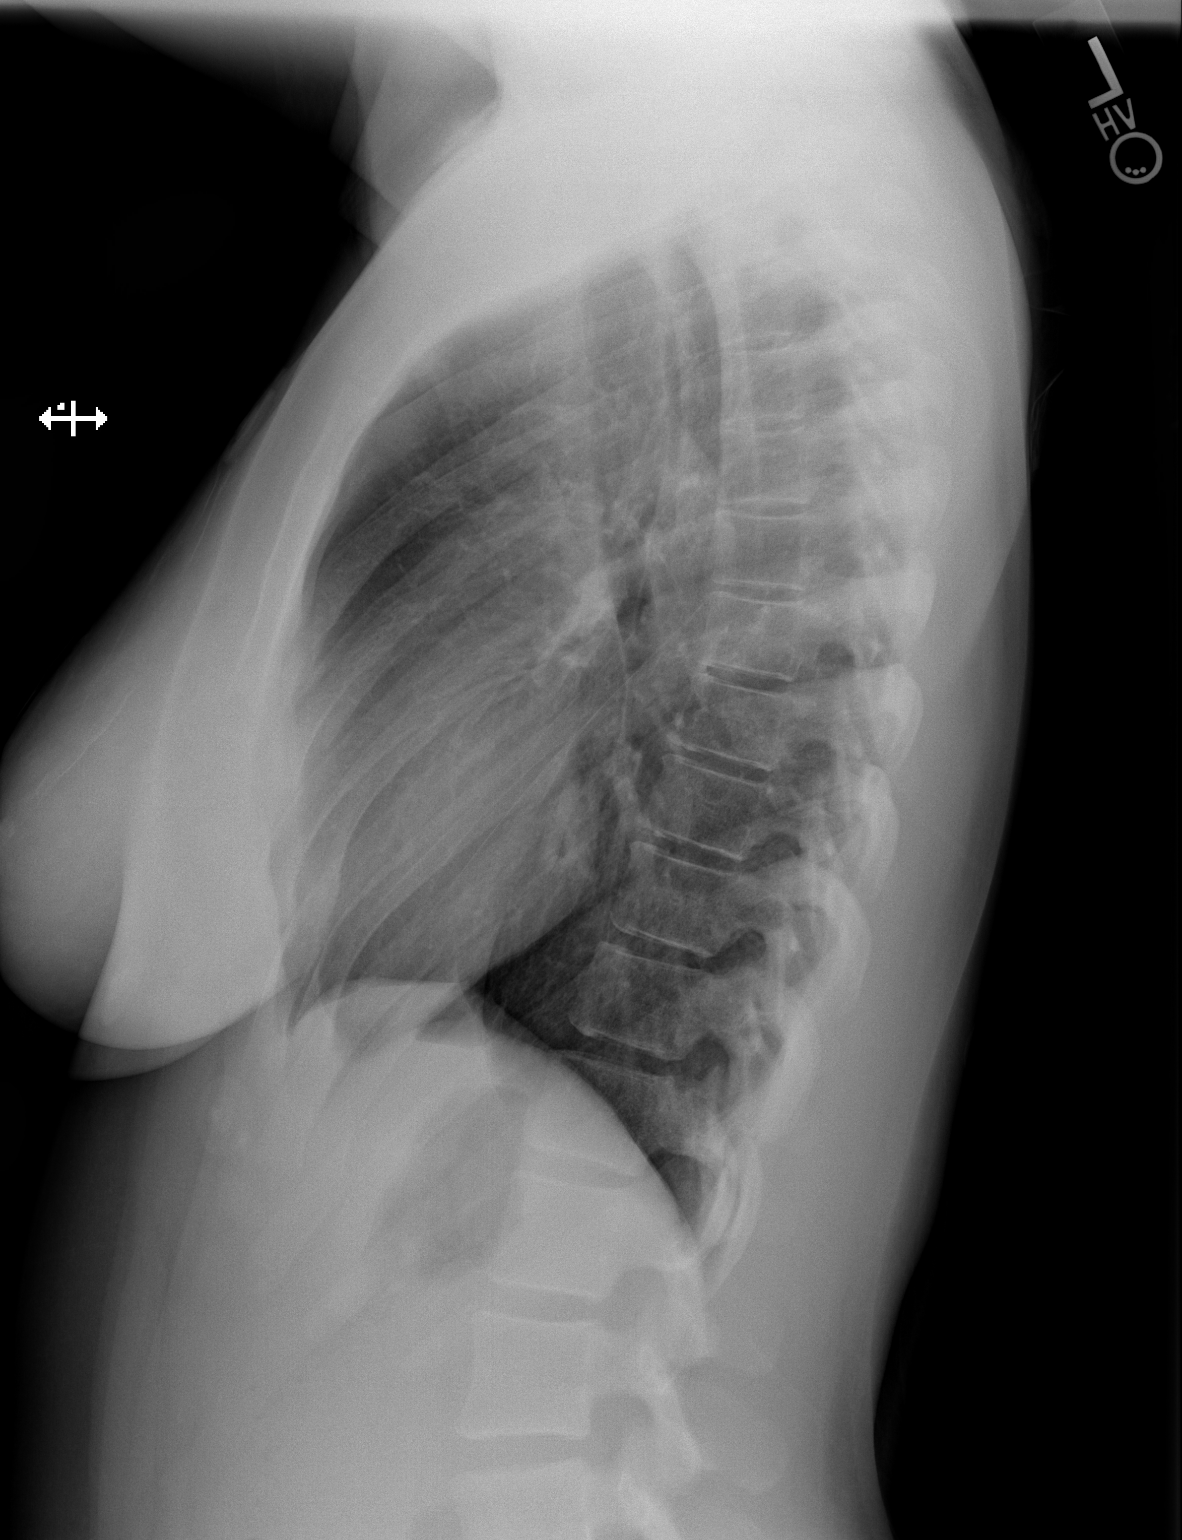

[2 of 2 positions shown; findings below may reference images not displayed]

FINDINGS: The heart size and mediastinal contours are within normal limits.
Both lungs are clear. The visualized skeletal structures are
unremarkable.
IMPRESSION: No active cardiopulmonary disease.

## 2021-12-20 ENCOUNTER — Other Ambulatory Visit: Payer: Self-pay

## 2021-12-20 ENCOUNTER — Ambulatory Visit (HOSPITAL_COMMUNITY): Admission: EM | Admit: 2021-12-20 | Payer: BC Managed Care – PPO | Source: Home / Self Care

## 2022-11-20 ENCOUNTER — Encounter (HOSPITAL_COMMUNITY): Payer: Self-pay | Admitting: Emergency Medicine

## 2022-11-20 ENCOUNTER — Ambulatory Visit (HOSPITAL_COMMUNITY)
Admission: EM | Admit: 2022-11-20 | Discharge: 2022-11-20 | Disposition: A | Discharge status: Home / Self Care | Payer: BC Managed Care – PPO

## 2022-11-20 DIAGNOSIS — R1031 Right lower quadrant pain: Secondary | ICD-10-CM

## 2022-11-20 NOTE — Discharge Instructions (Signed)
Advised to watch the area where a hernia is suspected.  Be careful straining lifting pushing or pulling objects.  If the hernia reoccurs a second time then advised to see PCP or return to urgent care for reevaluation and possible surgical referral.  Advised to use Citrucel, Metamucil, or FiberCon once or twice daily to help soften bowel movements and prevent from straining over the next couple weeks. Follow-up with PCP as needed.

## 2022-11-20 NOTE — ED Triage Notes (Signed)
Pt reports left lower abdominal pain. States she had a BM and strained and immediately felt the pain shortly after. States she noticed a bulging area and after stretching it went back in. Believes it was a hernia. States she still has pain in the area.

## 2022-11-20 NOTE — ED Provider Notes (Signed)
[facility]    CSN: [id number] Arrival date & time: 11/20/22  1442      History   Chief Complaint Chief Complaint  Patient presents with   Abdominal Pain    Suspected hernia - Entered by patient    HPI Jeanne Nelson is a 26 y.o. female.   26 year old female presents with abdominal pain.  Patient indicates this morning around 10 AM she was straining to have a bowel movement when she started having right lower quadrant abdominal pain.  She indicates that after she had the bowel movement she noticed there was a bulge in the right lower quadrant.  She indicates that she pushed and stretched and the bulge resolved.  Patient indicates that she has been doing well since then although she still can continues with mild discomfort right lower quadrant.  Patient indicates she has not have any fever or chills that she has been able to eat and drink fluids.  She indicates she has had some mild nausea but without any vomiting.  Patient is concerned that she may have a hernia so she does want to be evaluated today.  Patient does indicate that she has had some problems with constipation in the past   Abdominal Pain   Past Medical History:  Diagnosis Date   Asthma    Ovarian cyst     There are no problems to display for this patient.   Past Surgical History:  Procedure Laterality Date   HERNIA REPAIR      OB History   No obstetric history on file.      Home Medications    Prior to Admission medications   Medication Sig Start Date End Date Taking? Authorizing Provider  albuterol (VENTOLIN HFA) 108 (90 Base) MCG/ACT inhaler Inhale 1-2 puffs into the lungs every 6 (six) hours as needed for wheezing or shortness of breath.    [provider]  cloNIDine (CATAPRES) 0.1 MG tablet Take 0.1 mg by mouth as needed (blood pressure).  01/15/20   [provider]  HYDROcodone-acetaminophen (NORCO) 5-325 MG tablet Take 1 tablet by mouth every 6 (six) hours as  needed. Patient not taking: Reported on 04/20/2019 03/23/19   Arthor Captain, PA-C  naproxen (NAPROSYN) 500 MG tablet Take 1 tablet (500 mg total) by mouth 2 (two) times daily. 01/24/20   Arlyn Dunning, PA-C  omeprazole (PRILOSEC) 40 MG capsule Take 40 mg by mouth daily. 01/13/20   [provider]  ondansetron (ZOFRAN) 4 MG tablet Take 1 tablet (4 mg total) by mouth every 8 (eight) hours as needed for nausea or vomiting. Patient not taking: Reported on 01/24/2020 03/23/19   Arthor Captain, PA-C  Oxcarbazepine (TRILEPTAL) 300 MG tablet Take 300 mg by mouth daily.    [provider]  sertraline (ZOLOFT) 50 MG tablet Take 25 mg by mouth daily.  12/24/18   [provider]    Family History Family History  Problem Relation Age of Onset   Healthy Mother    Healthy Father     Social History Social History   Tobacco Use   Smoking status: Passive Smoke Exposure - Never Smoker   Smokeless tobacco: Never  Vaping Use   Vaping Use: Never used  Substance Use Topics   Alcohol use: Yes    Comment: Socially    Drug use: Never     Allergies   Lactose intolerance (gi)   Review of Systems Review of Systems  Gastrointestinal:  Positive for abdominal pain (right  lower quadrant).     Physical Exam Triage Vital Signs ED Triage Vitals  Enc Vitals Group     BP 11/20/22 1552 (!) 169/114     Pulse Rate 11/20/22 1552 89     Resp 11/20/22 1552 18     Temp 11/20/22 1552 98.7 F (37.1 C)     Temp Source 11/20/22 1552 Oral     SpO2 11/20/22 1552 99 %     Weight --      Height --      Head Circumference --      Peak Flow --      Pain Score 11/20/22 1550 4     Pain Loc --      Pain Edu? --      Excl. in GC? --    No data found.  Updated Vital Signs BP (!) 169/114 (BP Location: Left Arm)   Pulse 89   Temp 98.7 F (37.1 C) (Oral)   Resp 18   SpO2 99%   Visual Acuity Right Eye Distance:   Left Eye Distance:   Bilateral Distance:    Right Eye Near:    Left Eye Near:    Bilateral Near:     Physical Exam Constitutional:      Appearance: She is well-developed.  Abdominal:     General: Abdomen is flat. Bowel sounds are normal.     Palpations: Abdomen is soft.     Tenderness: There is abdominal tenderness in the right lower quadrant. There is no guarding or rebound.       Comments: Abdomen: There is no evidence of a right lower quadrant direct hernia on palpation or with straining.  Neurological:     Mental Status: She is alert.      UC Treatments / Results  Labs (all labs ordered are listed, but only abnormal results are displayed) Labs Reviewed - No data to display  EKG   Radiology No results found.  Procedures Procedures (including critical care time)  Medications Ordered in UC Medications - No data to display  Initial Impression / Assessment and Plan / UC Course  I have reviewed the triage vital signs and the nursing notes.  Pertinent labs & imaging results that were available during my care of the patient were reviewed by me and considered in my medical decision making (see chart for details).    Plan: 1.  The abdominal pain will be treated with the following: A.  Advised patient to take Tylenol and ibuprofen for pain relief if needed. B.  Advised to use FiberCon, Citrucel, or Metamucil twice daily to help relieve the constipation and prevent straining. C.  Advised that if the hernia area returns she needs to be followed up by her PCP or return to urgent care for possible surgical referral. 2.  Follow-up with PCP as needed. Final Clinical Impressions(s) / UC Diagnoses   Final diagnoses:  Right lower quadrant abdominal pain     Discharge Instructions      Advised to watch the area where a hernia is suspected.  Be careful straining lifting pushing or pulling objects.  If the hernia reoccurs a second time then advised to see PCP or return to urgent care for reevaluation and possible surgical referral.   Advised to use Citrucel, Metamucil, or FiberCon once or twice daily to help soften bowel movements and prevent from straining over the next couple weeks. Follow-up with PCP as needed.    ED Prescriptions   None  PDMP not reviewed this encounter.   Ellsworth Lennox, PA-C 11/20/22 1616

## 2023-01-06 ENCOUNTER — Other Ambulatory Visit: Payer: Self-pay

## 2023-01-06 ENCOUNTER — Encounter (HOSPITAL_COMMUNITY): Payer: Self-pay

## 2023-01-06 ENCOUNTER — Emergency Department (HOSPITAL_COMMUNITY)
Admission: EM | Admit: 2023-01-06 | Discharge: 2023-01-06 | Disposition: A | Discharge status: Home / Self Care | Payer: BC Managed Care – PPO | Attending: Emergency Medicine | Admitting: Emergency Medicine

## 2023-01-06 DIAGNOSIS — E860 Dehydration: Secondary | ICD-10-CM | POA: Diagnosis not present

## 2023-01-06 DIAGNOSIS — J101 Influenza due to other identified influenza virus with other respiratory manifestations: Secondary | ICD-10-CM | POA: Insufficient documentation

## 2023-01-06 DIAGNOSIS — R1013 Epigastric pain: Secondary | ICD-10-CM | POA: Diagnosis not present

## 2023-01-06 DIAGNOSIS — R000 Tachycardia, unspecified: Secondary | ICD-10-CM | POA: Insufficient documentation

## 2023-01-06 DIAGNOSIS — Z1152 Encounter for screening for COVID-19: Secondary | ICD-10-CM | POA: Diagnosis not present

## 2023-01-06 DIAGNOSIS — J111 Influenza due to unidentified influenza virus with other respiratory manifestations: Secondary | ICD-10-CM

## 2023-01-06 DIAGNOSIS — D649 Anemia, unspecified: Secondary | ICD-10-CM | POA: Diagnosis not present

## 2023-01-06 DIAGNOSIS — E871 Hypo-osmolality and hyponatremia: Secondary | ICD-10-CM | POA: Diagnosis not present

## 2023-01-06 DIAGNOSIS — J45909 Unspecified asthma, uncomplicated: Secondary | ICD-10-CM | POA: Diagnosis not present

## 2023-01-06 DIAGNOSIS — R509 Fever, unspecified: Secondary | ICD-10-CM | POA: Diagnosis present

## 2023-01-06 DIAGNOSIS — E876 Hypokalemia: Secondary | ICD-10-CM | POA: Diagnosis not present

## 2023-01-06 LAB — URINALYSIS, ROUTINE W REFLEX MICROSCOPIC
Bilirubin Urine: NEGATIVE
Glucose, UA: NEGATIVE mg/dL
Hgb urine dipstick: NEGATIVE
Ketones, ur: 5 mg/dL — AB
Leukocytes,Ua: NEGATIVE
Nitrite: NEGATIVE
Protein, ur: NEGATIVE mg/dL
Specific Gravity, Urine: 1.003 — ABNORMAL LOW (ref 1.005–1.030)
pH: 7 (ref 5.0–8.0)

## 2023-01-06 LAB — COMPREHENSIVE METABOLIC PANEL
ALT: 14 U/L (ref 0–44)
AST: 21 U/L (ref 15–41)
Albumin: 3.6 g/dL (ref 3.5–5.0)
Alkaline Phosphatase: 25 U/L — ABNORMAL LOW (ref 38–126)
Anion gap: 4 — ABNORMAL LOW (ref 5–15)
BUN: <5 mg/dL — ABNORMAL LOW (ref 6–20)
CO2: 22 mmol/L (ref 22–32)
Calcium: 8.3 mg/dL — ABNORMAL LOW (ref 8.9–10.3)
Chloride: 104 mmol/L (ref 98–111)
Creatinine, Ser: 0.86 mg/dL (ref 0.44–1.00)
GFR, Estimated: >60 mL/min (ref >60)
Glucose, Bld: 86 mg/dL (ref 70–99)
Potassium: 3.4 mmol/L — ABNORMAL LOW (ref 3.5–5.1)
Sodium: 130 mmol/L — ABNORMAL LOW (ref 135–145)
Total Bilirubin: 0.5 mg/dL (ref 0.3–1.2)
Total Protein: 7.3 g/dL (ref 6.5–8.1)

## 2023-01-06 LAB — LIPASE, BLOOD: Lipase: 42 U/L (ref 11–51)

## 2023-01-06 LAB — CBC
HCT: 30.9 % — ABNORMAL LOW (ref 36.0–46.0)
Hemoglobin: 9.7 g/dL — ABNORMAL LOW (ref 12.0–15.0)
MCH: 25.7 pg — ABNORMAL LOW (ref 26.0–34.0)
MCHC: 31.4 g/dL (ref 30.0–36.0)
MCV: 81.7 fL (ref 80.0–100.0)
Platelets: 311 10*3/uL (ref 150–400)
RBC: 3.78 MIL/uL — ABNORMAL LOW (ref 3.87–5.11)
RDW: 25.7 % — ABNORMAL HIGH (ref 11.5–15.5)
WBC: 3.4 10*3/uL — ABNORMAL LOW (ref 4.0–10.5)
nRBC: 0 % (ref 0.0–0.2)

## 2023-01-06 LAB — I-STAT BETA HCG BLOOD, ED (MC, WL, AP ONLY): I-stat hCG, quantitative: <5 m[IU]/mL (ref <5)

## 2023-01-06 LAB — RESP PANEL BY RT-PCR (RSV, FLU A&B, COVID)  RVPGX2
Influenza A by PCR: NEGATIVE
Influenza B by PCR: POSITIVE — AB
Resp Syncytial Virus by PCR: NEGATIVE
SARS Coronavirus 2 by RT PCR: NEGATIVE

## 2023-01-06 MED ORDER — SODIUM CHLORIDE 0.9 % IV BOLUS
1000.0000 mL | Freq: Once | INTRAVENOUS | Status: AC
  Administered 2023-01-06: 1000 mL via INTRAVENOUS

## 2023-01-06 MED ORDER — SODIUM CHLORIDE 0.9 % IV BOLUS
500.0000 mL | Freq: Once | INTRAVENOUS | Status: AC
  Administered 2023-01-06: 500 mL via INTRAVENOUS

## 2023-01-06 MED ORDER — ONDANSETRON 4 MG PO TBDP
4.0000 mg | ORAL_TABLET | Freq: Once | ORAL | Status: AC
  Administered 2023-01-06: 4 mg via ORAL
  Filled 2023-01-06: qty 1

## 2023-01-06 MED ORDER — METHOCARBAMOL 500 MG PO TABS
500.0000 mg | ORAL_TABLET | Freq: Once | ORAL | Status: AC
  Administered 2023-01-06: 500 mg via ORAL
  Filled 2023-01-06: qty 1

## 2023-01-06 MED ORDER — ONDANSETRON HCL 4 MG PO TABS: 4.0000 mg | ORAL_TABLET | Freq: Four times a day (QID) | ORAL | 0 refills | Status: AC

## 2023-01-06 MED ORDER — ACETAMINOPHEN 500 MG PO TABS
1000.0000 mg | ORAL_TABLET | Freq: Once | ORAL | Status: AC
  Administered 2023-01-06: 1000 mg via ORAL
  Filled 2023-01-06: qty 2

## 2023-01-06 NOTE — Discharge Instructions (Addendum)
You have the flu this is a viral infection that will likely start to improve after 7-10 days, antibiotics are not helpful in treating viral infections. You may use Zofran as needed for nausea. Please make sure you are drinking plenty of fluids. You can treat your symptoms supportively with tylenol 650 mg/1035m and ibuprofen 600 mg every 6 hours for fevers and pains. For nasal congestion you can use Zyrtec and Flonase to help with nasal congestion. To treat cough you can use over the counter cough medications such as Mucinex DM or Robitussin and throat lozenges. If your symptoms are not improving please follow up with you Primary doctor.   If you develop persistent fevers, shortness of breath or difficulty breathing, chest pain, severe headache and neck pain, persistent nausea and vomiting or other new or concerning symptoms return to the Emergency department.

## 2023-01-06 NOTE — ED Provider Notes (Signed)
[place] EMERGENCY DEPARTMENT AT [facility] Provider Note   CSN: [id number] Arrival date & time: 01/06/23  1636     History  Chief Complaint  Patient presents with   Abdominal Pain    Jeanne Nelson is a 27 y.o. female with past medical history significant for asthma, ovarian cyst who presents with concern for abdominal pain, back pain, general malaise, fever, nausea, vomiting over the last 2 days.  Patient reports that abdominal pain is most focal over epigastric region.  She denies any diarrhea, constipation, previous history of intra-abdominal surgery.  Patient denies any recent sick contacts.  She did not get a flu shot this year.   Abdominal Pain      Home Medications Prior to Admission medications   Medication Sig Start Date End Date Taking? Authorizing Provider  ondansetron (ZOFRAN) 4 MG tablet Take 1 tablet (4 mg total) by mouth every 6 (six) hours. 01/06/23  Yes Lyniah Fujita H, PA-C  albuterol (VENTOLIN HFA) 108 (90 Base) MCG/ACT inhaler Inhale 1-2 puffs into the lungs every 6 (six) hours as needed for wheezing or shortness of breath.    [provider]  cloNIDine (CATAPRES) 0.1 MG tablet Take 0.1 mg by mouth as needed (blood pressure).  01/15/20   [provider]  HYDROcodone-acetaminophen (NORCO) 5-325 MG tablet Take 1 tablet by mouth every 6 (six) hours as needed. Patient not taking: Reported on 04/20/2019 03/23/19   Arthor Captain, PA-C  naproxen (NAPROSYN) 500 MG tablet Take 1 tablet (500 mg total) by mouth 2 (two) times daily. 01/24/20   Arlyn Dunning, PA-C  omeprazole (PRILOSEC) 40 MG capsule Take 40 mg by mouth daily. 01/13/20   [provider]  Oxcarbazepine (TRILEPTAL) 300 MG tablet Take 300 mg by mouth daily.    [provider]  sertraline (ZOLOFT) 50 MG tablet Take 25 mg by mouth daily.  12/24/18   [provider]      Allergies    Lactose intolerance (gi)    Review of Systems   Review of Systems   Gastrointestinal:  Positive for abdominal pain.  All other systems reviewed and are negative.   Physical Exam Updated Vital Signs BP (!) 145/101   Pulse 75   Temp 99.4 F (37.4 C) (Oral)   Resp 17   SpO2 100%  Physical Exam Vitals and nursing note reviewed.  Constitutional:      General: She is not in acute distress.    Appearance: Normal appearance. She is ill-appearing.  HENT:     Head: Normocephalic and atraumatic.     Mouth/Throat:     Comments: No significant posterior oropharynx erythema, swelling, exudate. Uvula midline, tonsils 1+ bilaterally.  No trismus, stridor, evidence of PTA, floor of mouth swelling or redness.   Eyes:     General:        Right eye: No discharge.        Left eye: No discharge.  Cardiovascular:     Rate and Rhythm: Regular rhythm. Tachycardia present.     Heart sounds: No murmur heard.    No friction rub. No gallop.  Pulmonary:     Effort: Pulmonary effort is normal.     Breath sounds: Normal breath sounds.  Abdominal:     General: Bowel sounds are normal.     Palpations: Abdomen is soft.     Comments: Tenderness to palpation diffusely, most focally in epigastric region, no rebound, rigidity, guarding  Skin:    General: Skin is  warm and dry.     Capillary Refill: Capillary refill takes less than 2 seconds.     Comments: Warm to touch  Neurological:     Mental Status: She is alert and oriented to person, place, and time.  Psychiatric:        Mood and Affect: Mood normal.        Behavior: Behavior normal.     ED Results / Procedures / Treatments   Labs (all labs ordered are listed, but only abnormal results are displayed) Labs Reviewed  RESP PANEL BY RT-PCR (RSV, FLU A&B, COVID)  RVPGX2 - Abnormal; Notable for the following components:      Result Value   Influenza B by PCR POSITIVE (*)    All other components within normal limits  COMPREHENSIVE METABOLIC PANEL - Abnormal; Notable for the following components:   Sodium 130 (*)     Potassium 3.4 (*)    BUN <5 (*)    Calcium 8.3 (*)    Alkaline Phosphatase 25 (*)    Anion gap 4 (*)    All other components within normal limits  CBC - Abnormal; Notable for the following components:   WBC 3.4 (*)    RBC 3.78 (*)    Hemoglobin 9.7 (*)    HCT 30.9 (*)    MCH 25.7 (*)    RDW 25.7 (*)    All other components within normal limits  URINALYSIS, ROUTINE W REFLEX MICROSCOPIC - Abnormal; Notable for the following components:   Color, Urine COLORLESS (*)    Specific Gravity, Urine 1.003 (*)    Ketones, ur 5 (*)    All other components within normal limits  LIPASE, BLOOD  I-STAT BETA HCG BLOOD, ED ([facility], [facility], [facility] ONLY)    EKG None  Radiology No results found.  Procedures Procedures    Medications Ordered in ED Medications  sodium chloride 0.9 % bolus 500 mL (has no administration in time range)  methocarbamol (ROBAXIN) tablet 500 mg (has no administration in time range)  sodium chloride 0.9 % bolus 1,000 mL (1,000 mLs Intravenous New Bag/Given 01/06/23 1720)  acetaminophen (TYLENOL) tablet 1,000 mg (1,000 mg Oral Given 01/06/23 1719)  ondansetron (ZOFRAN-ODT) disintegrating tablet 4 mg (4 mg Oral Given 01/06/23 1719)    ED Course/ Medical Decision Making/ A&P                             Medical Decision Making Amount and/or Complexity of Data Reviewed Labs: ordered.   This patient is a 27 y.o. female who presents to the ED for concern of fever, bodyaches, emesis, back pain, this involves an extensive number of treatment options, and is a complaint that carries with it a high risk of complications and morbidity. The emergent differential diagnosis prior to evaluation includes, but is not limited to,  The causes of generalized abdominal pain include but are not limited to AAA, mesenteric ischemia, appendicitis, diverticulitis, DKA, gastritis, gastroenteritis, AMI, nephrolithiasis, pancreatitis, peritonitis, adrenal insufficiency,lead poisoning, iron toxicity,  intestinal ischemia, constipation, UTI,SBO/LBO, splenic rupture, biliary disease, IBD, IBS, PUD, or hepatitis, considered, flu, covid, or other URI with GI symptoms. This is not an exhaustive differential.   Past Medical History / Co-morbidities / Social History: Reviewed history of asthma, previous ovarian cyst, previous hernia repair, no other significant  Physical Exam: Physical exam performed. The pertinent findings include: Patient arrives febrile with temperature 102.2, she is mildly tachycardic with pulse of 104,  hypertension with blood pressure 159/106, but with normal respiratory rate, stable oxygen saturations on room air.  She is somewhat ill-appearing,`  Lab Tests: I ordered, and personally interpreted labs.  The pertinent results include: CMP notable for hyponatremia, sodium 130, mild hypokalemia, potassium 3.4.  CBC with slight worsening of patient's chronic anemia, hemoglobin 9.7, she does not have a leukocytosis.  Lipase unremarkable, RVP negative for flu B.  UA with some ketones suggestive of dehydration.   Medications: I ordered medication including fluids, Tylenol, Robaxin for body aches, dehydration, Zofran for nausea. Reevaluation of the patient after these medicines showed that the patient improved. I have reviewed the patients home medicines and have made adjustments as needed.   Disposition: After consideration of the diagnostic results and the patients response to treatment, I feel that patient symptoms are consistent with the flu, will discharge with Zofran, discussed Tamiflu and patient decided against taking at this time.  emergency department workup does not suggest an emergent condition requiring admission or immediate intervention beyond what has been performed at this time. The plan is: as above, plenty of fluids, Zofran, other supportive care for acute flu. The patient is safe for discharge and has been instructed to return immediately for worsening symptoms, change  in symptoms or any other concerns.  Final Clinical Impression(s) / ED Diagnoses Final diagnoses:  Flu  Dehydration    Rx / DC Orders ED Discharge Orders          Ordered    ondansetron (ZOFRAN) 4 MG tablet  Every 6 hours        01/06/23 1924              Olene Floss, PA-C 01/06/23 1925    Sloan Leiter, DO 01/07/23 1338

## 2023-01-06 NOTE — ED Triage Notes (Signed)
Abdominal pain that started yesterday with vomiting, today she has had a fever.
